# Patient Record
Sex: Female | Born: 1935 | Race: Black or African American | Hispanic: No
Health system: Southern US, Community
[De-identification: ages and names within clinical notes are randomized; demographics above are authoritative.]

---

## 2003-07-10 ENCOUNTER — Other Ambulatory Visit: Payer: Self-pay

## 2004-04-19 ENCOUNTER — Emergency Department: Payer: Self-pay | Admitting: General Practice

## 2004-08-19 ENCOUNTER — Other Ambulatory Visit: Payer: Self-pay

## 2004-08-19 ENCOUNTER — Emergency Department: Payer: Self-pay | Admitting: General Practice

## 2004-12-30 ENCOUNTER — Emergency Department: Payer: Self-pay | Admitting: Emergency Medicine

## 2005-09-16 ENCOUNTER — Emergency Department: Payer: Self-pay | Admitting: Emergency Medicine

## 2005-10-09 ENCOUNTER — Other Ambulatory Visit: Payer: Self-pay

## 2005-10-10 ENCOUNTER — Inpatient Hospital Stay: Payer: Self-pay | Admitting: Internal Medicine

## 2007-11-12 ENCOUNTER — Ambulatory Visit: Payer: Self-pay | Admitting: Internal Medicine

## 2008-10-14 ENCOUNTER — Inpatient Hospital Stay: Payer: Self-pay | Admitting: Internal Medicine

## 2009-06-19 ENCOUNTER — Ambulatory Visit: Payer: Self-pay | Admitting: Vascular Surgery

## 2010-06-29 ENCOUNTER — Emergency Department: Payer: Self-pay | Admitting: Emergency Medicine

## 2012-01-14 LAB — CBC
HCT: 40.3 % (ref 35.0–47.0)
HGB: 13 g/dL (ref 12.0–16.0)
MCHC: 32.2 g/dL (ref 32.0–36.0)
RBC: 4.32 10*6/uL (ref 3.80–5.20)
WBC: 8.6 10*3/uL (ref 3.6–11.0)

## 2012-01-14 LAB — COMPREHENSIVE METABOLIC PANEL
Albumin: 2.7 g/dL — ABNORMAL LOW (ref 3.4–5.0)
Anion Gap: 12 (ref 7–16)
BUN: 15 mg/dL (ref 7–18)
Bilirubin,Total: 0.3 mg/dL (ref 0.2–1.0)
Chloride: 104 mmol/L (ref 98–107)
Creatinine: 0.71 mg/dL (ref 0.60–1.30)
EGFR (African American): >60
Glucose: 287 mg/dL — ABNORMAL HIGH (ref 65–99)
Potassium: 4 mmol/L (ref 3.5–5.1)
SGOT(AST): 28 U/L (ref 15–37)
Sodium: 139 mmol/L (ref 136–145)
Total Protein: 7.2 g/dL (ref 6.4–8.2)

## 2012-01-14 LAB — URINALYSIS, COMPLETE
Glucose,UR: >=500 mg/dL (ref 0–75)
Ketone: NEGATIVE
Nitrite: NEGATIVE
Ph: 6 (ref 4.5–8.0)
Protein: NEGATIVE
Specific Gravity: 1.013 (ref 1.003–1.030)

## 2012-01-14 LAB — TROPONIN I: Troponin-I: <0.02 ng/mL

## 2012-01-14 LAB — LIPASE, BLOOD: Lipase: 88 U/L (ref 73–393)

## 2012-01-14 LAB — CK TOTAL AND CKMB (NOT AT ARMC): CK, Total: 43 U/L (ref 21–215)

## 2012-01-15 ENCOUNTER — Inpatient Hospital Stay: Payer: Self-pay | Admitting: Internal Medicine

## 2012-01-16 LAB — URINE CULTURE

## 2012-01-16 LAB — BASIC METABOLIC PANEL
BUN: 9 mg/dL (ref 7–18)
Calcium, Total: 8.6 mg/dL (ref 8.5–10.1)
Co2: 24 mmol/L (ref 21–32)
EGFR (African American): >60
Sodium: 141 mmol/L (ref 136–145)

## 2012-01-20 LAB — CULTURE, BLOOD (SINGLE)

## 2013-01-10 ENCOUNTER — Emergency Department: Payer: Self-pay | Admitting: Unknown Physician Specialty

## 2013-01-10 LAB — URINALYSIS, COMPLETE
Bilirubin,UR: NEGATIVE
Blood: NEGATIVE
Glucose,UR: NEGATIVE mg/dL (ref 0–75)
Ketone: NEGATIVE
Nitrite: NEGATIVE
Ph: 7 (ref 4.5–8.0)
Protein: 100
Specific Gravity: 1.024 (ref 1.003–1.030)
Squamous Epithelial: 1
WBC UR: 67 /HPF (ref 0–5)

## 2013-01-10 LAB — COMPREHENSIVE METABOLIC PANEL
BUN: 14 mg/dL (ref 7–18)
Creatinine: 0.92 mg/dL (ref 0.60–1.30)
EGFR (African American): 60 — ABNORMAL LOW
Potassium: 3.5 mmol/L (ref 3.5–5.1)
SGOT(AST): 24 U/L (ref 15–37)
SGPT (ALT): 21 U/L (ref 12–78)
Sodium: 142 mmol/L (ref 136–145)
Total Protein: 7.4 g/dL (ref 6.4–8.2)

## 2013-01-10 LAB — CBC WITH DIFFERENTIAL/PLATELET
Basophil #: 0.1 10*3/uL (ref 0.0–0.1)
Basophil %: 1.2 %
Eosinophil #: 0.1 10*3/uL (ref 0.0–0.7)
HCT: 42.4 % (ref 35.0–47.0)
Lymphocyte %: 16.8 %
MCH: 28.9 pg (ref 26.0–34.0)
MCHC: 31.9 g/dL — ABNORMAL LOW (ref 32.0–36.0)
Platelet: 173 10*3/uL (ref 150–440)
RBC: 4.69 10*6/uL (ref 3.80–5.20)
RDW: 14.7 % — ABNORMAL HIGH (ref 11.5–14.5)
WBC: 5.9 10*3/uL (ref 3.6–11.0)

## 2013-01-11 LAB — URINE CULTURE

## 2013-01-28 ENCOUNTER — Ambulatory Visit: Payer: Self-pay | Admitting: Internal Medicine

## 2013-02-20 LAB — CBC
HCT: 38.1 % (ref 35.0–47.0)
HGB: 12.9 g/dL (ref 12.0–16.0)
MCHC: 33.9 g/dL (ref 32.0–36.0)
MCV: 91 fL (ref 80–100)
Platelet: 210 10*3/uL (ref 150–440)
RDW: 14.5 % (ref 11.5–14.5)

## 2013-02-20 LAB — URINALYSIS, COMPLETE
Bilirubin,UR: NEGATIVE
Blood: NEGATIVE
Ketone: NEGATIVE
Ph: 6 (ref 4.5–8.0)
Protein: 25
RBC,UR: 2 /HPF (ref 0–5)
WBC UR: 73 /HPF (ref 0–5)

## 2013-02-20 LAB — COMPREHENSIVE METABOLIC PANEL
Alkaline Phosphatase: 127 U/L (ref 50–136)
BUN: 23 mg/dL — ABNORMAL HIGH (ref 7–18)
Calcium, Total: 9.2 mg/dL (ref 8.5–10.1)
Chloride: 106 mmol/L (ref 98–107)
Co2: 22 mmol/L (ref 21–32)
EGFR (Non-African Amer.): >60
Glucose: 234 mg/dL — ABNORMAL HIGH (ref 65–99)
Osmolality: 285 (ref 275–301)
Potassium: 3.6 mmol/L (ref 3.5–5.1)
SGOT(AST): 28 U/L (ref 15–37)

## 2013-02-20 LAB — TROPONIN I: Troponin-I: <0.02 ng/mL

## 2013-02-21 ENCOUNTER — Inpatient Hospital Stay: Payer: Self-pay | Admitting: Internal Medicine

## 2013-02-22 LAB — BASIC METABOLIC PANEL
Calcium, Total: 8.4 mg/dL — ABNORMAL LOW (ref 8.5–10.1)
Chloride: 112 mmol/L — ABNORMAL HIGH (ref 98–107)
Creatinine: 0.63 mg/dL (ref 0.60–1.30)
Glucose: 173 mg/dL — ABNORMAL HIGH (ref 65–99)
Osmolality: 285 (ref 275–301)
Sodium: 140 mmol/L (ref 136–145)

## 2013-02-22 LAB — CBC WITH DIFFERENTIAL/PLATELET
Eosinophil #: 0.1 10*3/uL (ref 0.0–0.7)
Lymphocyte %: 15.4 %
MCH: 30.7 pg (ref 26.0–34.0)
MCHC: 34.1 g/dL (ref 32.0–36.0)
MCV: 90 fL (ref 80–100)
Monocyte #: 0.7 x10 3/mm (ref 0.2–0.9)
Neutrophil #: 5.9 10*3/uL (ref 1.4–6.5)
Neutrophil %: 73.7 %
Platelet: 208 10*3/uL (ref 150–440)
RBC: 3.42 10*6/uL — ABNORMAL LOW (ref 3.80–5.20)
RDW: 14.4 % (ref 11.5–14.5)
WBC: 8 10*3/uL (ref 3.6–11.0)

## 2013-02-24 LAB — CBC WITH DIFFERENTIAL/PLATELET
Basophil %: 1 %
Eosinophil #: 0.2 10*3/uL (ref 0.0–0.7)
Eosinophil %: 3.2 %
HCT: 31.7 % — ABNORMAL LOW (ref 35.0–47.0)
HGB: 10.6 g/dL — ABNORMAL LOW (ref 12.0–16.0)
Lymphocyte #: 1.3 10*3/uL (ref 1.0–3.6)
Lymphocyte %: 19.4 %
MCHC: 33.4 g/dL (ref 32.0–36.0)
MCV: 91 fL (ref 80–100)
Monocyte #: 0.6 x10 3/mm (ref 0.2–0.9)
RBC: 3.48 10*6/uL — ABNORMAL LOW (ref 3.80–5.20)
RDW: 14.3 % (ref 11.5–14.5)
WBC: 6.5 10*3/uL (ref 3.6–11.0)

## 2013-02-25 LAB — CULTURE, BLOOD (SINGLE)

## 2013-02-26 LAB — WOUND CULTURE

## 2013-02-28 ENCOUNTER — Ambulatory Visit: Payer: Self-pay | Admitting: Internal Medicine

## 2013-03-30 DEATH — deceased

## 2014-10-16 IMAGING — CR DG CHEST 2V
1 series · 2 of 2 positions shown · non-contrast
Comparison: none

REASON FOR EXAM: fall
COMMENTS:

PROCEDURE:     DXR - DXR CHEST PA (OR AP) AND LATERAL  - January 10, 2013  [DATE]
RESULT:     Comparison: 01/15/2012

[Series 1: ap · 0.17mm/px · 2 of 2 slices shown]
[im 1/2]
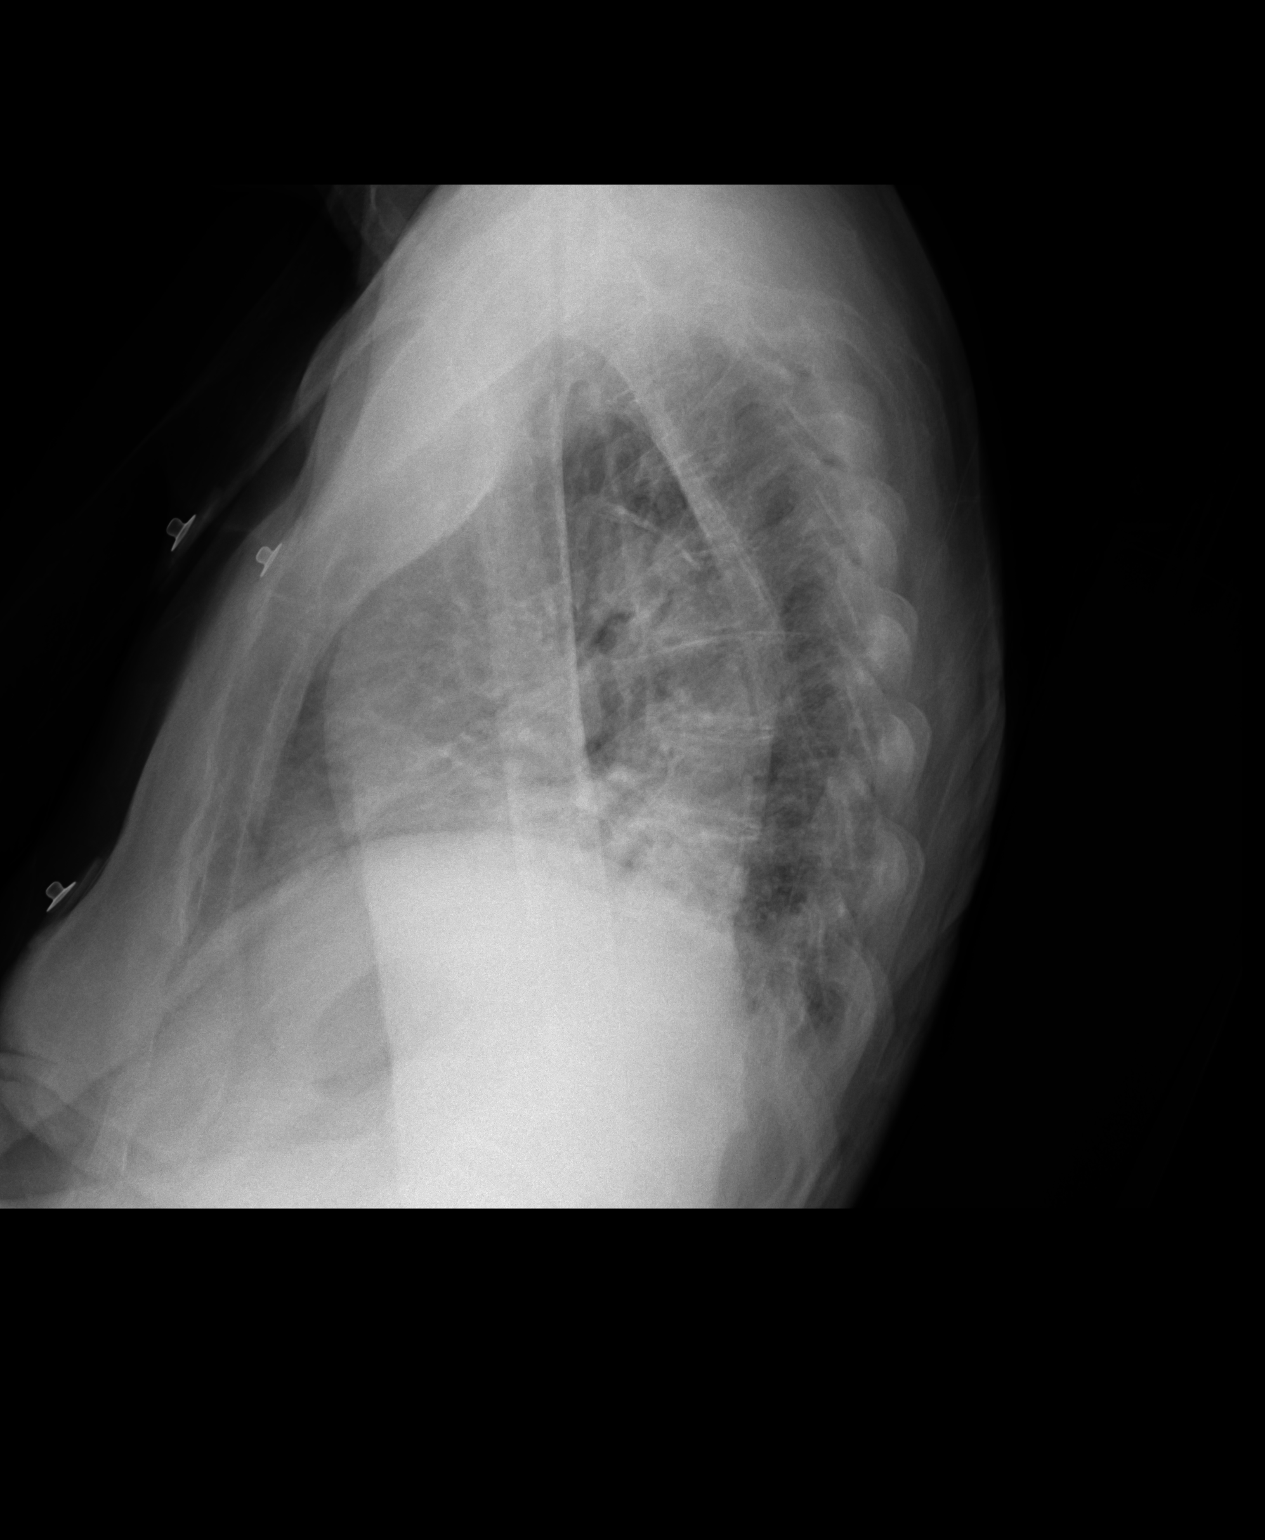
[im 2/2]
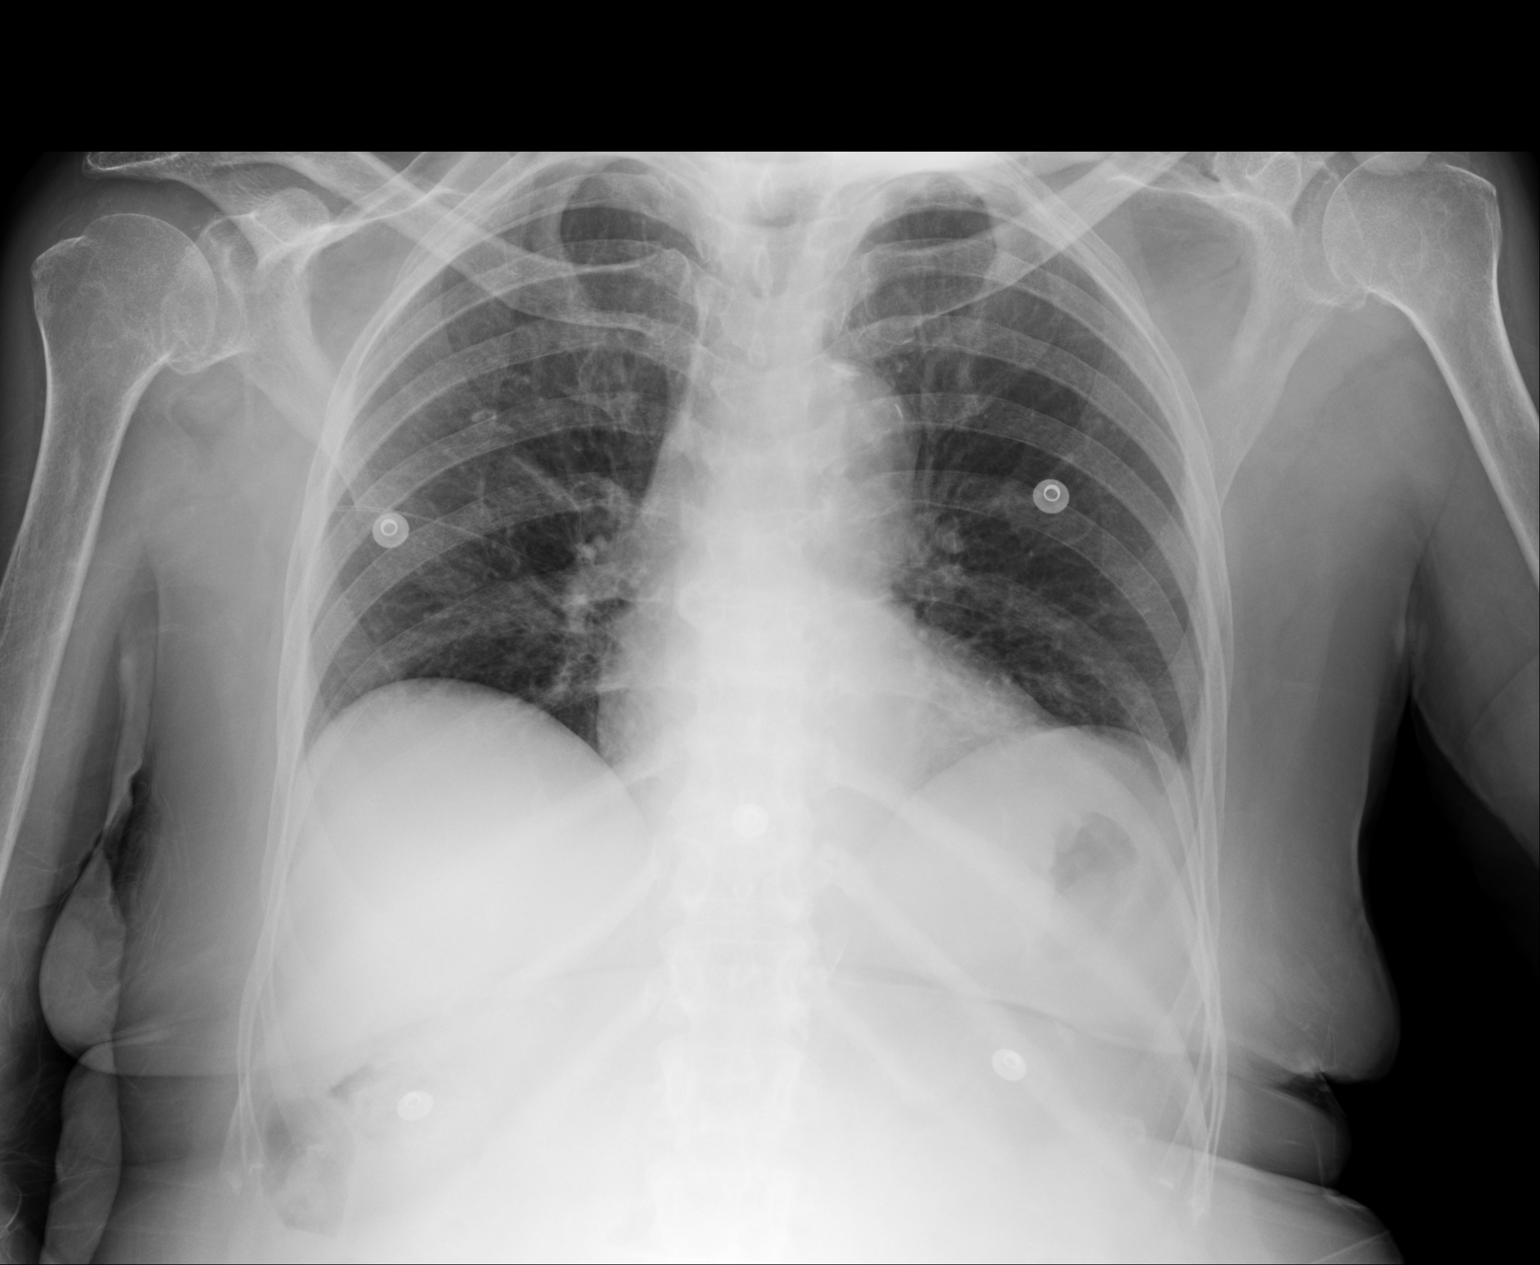

[2 of 2 positions shown; findings below may reference images not displayed]

FINDINGS: Evaluation is slightly limited by patient positioning. The heart and
mediastinum are stable. The lung volumes are low. Mild basilar opacities are
likely secondary to atelectasis.
IMPRESSION: Mild basilar opacities are likely secondary to atelectasis given the low
lung volumes.

[REDACTED]

## 2014-10-17 NOTE — H&P (Signed)
PATIENT NAME:  Angie Peters, Angie Peters MR#:  [id number] DATE OF BIRTH:  Jun 24, 1911  DATE OF ADMISSION:  01/15/2012  CHIEF COMPLAINT: Altered mental status and decreased responsiveness.   HISTORY OF PRESENT ILLNESS: Mrs. Hoheisel is a 81 year old African-American female with history of dementia. She is bedridden, cared for by her family. Lately it was noticed by the family that she is not eating or swallowing her food. Over the last 24 hours she became less responsive and she was brought to the Emergency Department for evaluation. Work-up here was unremarkable except for underlying urinary tract infection. She had a CT scan of the head that showed small vessel disease, but no acute changes. The family requested conservative therapy, no extraordinary measures, no PEG tube. They do not want Hospice involvement at this point. However, if she does not improve they will consider the option to go home with Hospice.   REVIEW OF SYSTEMS: 10-point system review was unobtainable due to patient being unresponsive.   PAST MEDICAL HISTORY:  1. History of dementia.  2. Diabetes mellitus type 2.  3. Systemic hypertension.   PAST SURGICAL HISTORY: Hysterectomy.   ADMISSION MEDICATIONS:  1. Omeprazole 20 mg once a day.  2. NovoLog insulin sliding scale. 3. Lantus 12 units once a day at bedtime.  4. Colcrys 0.6 mg once a day.  5. Centrum silver, 1 tablet once a day.  6. Aspirin 81 mg a day.   ALLERGIES: No known drug allergies.   SOCIAL HABITS: Nonsmoker. No history of alcohol abuse.   FAMILY HISTORY: Her mother died from complications of congestive heart failure, the age was unclear to her and the daughters could not give adequate information. Her father died in his 23s from alcohol complications. She has two daughters who have hypertension. Four of her children have diabetes mellitus.   PHYSICAL EXAMINATION:  VITAL SIGNS: Blood pressure 200/83, respiratory rate 18, pulse 88, temperature 97.6, oxygen saturation  99%.   GENERAL APPEARANCE: Elderly female lying in bed in no acute distress. She does not communicate.   HEAD: No pallor. No icterus. No cyanosis.   ENT: Hearing cannot be assessed due to the patient's unresponsiveness. Nasal mucosa and lips were unremarkable. I could not examine the mouth as the patient is resisting opening the mouth.   EYES: Normal eyelids, could not assess conjunctivae or the pupils, the patient again is resistant to opening her eyes.   NECK: Supple. Trachea at midline. No thyromegaly. No cervical lymphadenopathy. No masses.   HEART: Normal S1, S2. No S3 or S4. No murmur. No gallop. No carotid bruits.   RESPIRATORY: Normal breathing pattern without use of accessory muscles. No rales. No wheezing.   ABDOMEN: Soft without rigidity. No masses. No hepatosplenomegaly. No hernias.   SKIN: No ulcers. No subcutaneous nodules.   MUSCULOSKELETAL: No joint swelling. No clubbing.   NEUROLOGIC: The patient is obtunded. Not much response other than minimal movements of upper extremities and the face. Resists opening the mouth or the eyes. No facial asymmetry.   PSYCHIATRIC: Unobtainable due to the patient's decreased responsiveness.   LABORATORY, DIAGNOSTIC, AND RADIOLOGICAL DATA: CT scan of the head showed no acute intracranial process. Chronic small vessel ischemic disease. Serum glucose 287, BUN 15, creatinine 0.7, sodium 139, potassium 4. Liver function tests showed total protein 7.2, albumin 2.7, alkaline phosphatase 144, AST and ALT were normal. Troponin less than 0.02. CBC was normal. Urinalysis showed cloudy urine, more than 500 glucose, 50 white blood cells, and +3 bacteria.   ASSESSMENT:  1. Altered mental status and decreased responsiveness secondary to likely progression of her dementia, but I cannot rule out possible superimposed stroke that did not show up on the CT scan.  2. Urinary tract infection.  3. Dementia.  4. Diabetes mellitus, type 2.  5. Severe systemic  hypertension, uncontrolled.  6. DO NOT RESUSCITATE status.  PLAN: We will admit the patient. IV hydration. IV antibiotic using Rocephin. Blood pressure control using clonidine patch and p.r.n. doses of intravenous labetalol. I had a discussion with the family. All three daughters here are in agreement with conservative management, no extraordinary measures. No PEG tube.  They are in agreement with the line of management of IV hydration and IV antibiotics.  If her mental status improves and she is able to eat and drink, then she will go home. If she does not improve they are also in agreement that she will go home with Hospice this time. The patient does not have a LIVING WILL but again, she has documented DO NOT RESUSCITATE status, a form that was signed by her primary care physician.   TIME SPENT EVALUATING THIS PATIENT: More than 55 minutes.    ____________________________ Carney CornersAmir M. Rudene Rearwish, MD amd:bjt D: 01/15/2012 00:28:34 ET T: 01/15/2012 07:30:58 ET JOB#: [id number]  cc: Carney CornersAmir M. Rudene Rearwish, MD, <Dictator> Jillene Bucksenny C. Arlana Pouchate, MD Carney CornersAMIR M Elvia Aydin MD ELECTRONICALLY SIGNED 01/16/2012 4:58

## 2014-10-17 NOTE — Discharge Summary (Signed)
PATIENT NAME:  Angie Peters, Angie Peters MR#:  [id number] DATE OF BIRTH:  30-May-1911  DATE OF ADMISSION:  01/15/2012 DATE OF DISCHARGE:  01/16/2012  PRIMARY CARE PHYSICIAN: Dr. Dewaine Oatsenny Tate    FINAL DIAGNOSES:  1. Encephalopathy.  2. Urinary tract infection.  3. Underlying dementia.  4. Malignant hypertension.  5. Diabetes.  6. Gout.   MEDICATIONS ON DISCHARGE:  1. Eyedrop that she normally takes at home. 2. Aspirin 81 mg daily.  3. Centrum Silver 1 tablet daily.  4. Colcrys 0.6 mg daily.  5. Lantus 12 units subcutaneous injection at night.  6. Omeprazole 20 mg daily.  7. NovoLog sliding scale. 8. Clonidine patch 0.1 mg extended-release once a week transdermally. 9. Initially prescribed Cipro 500 mg twice a day but once urine culture came back I had to change that to Keflex 500 mg 3 times a day for five more days. I called the patient in the pharmacy to call in the Keflex and canceled the Cipro prescription. 10. DuoDERM to buttock every five days.   DIET: Low sodium, 1800 ADA. Diet is puree. Moisten foods.   FOLLOW-UP: Follow-up in one week with Dr. Dewaine Oatsenny Tate.   REASON FOR ADMISSION: The patient was admitted 01/15/2012, discharged 01/16/2012. Please see admitting history and physical by Dr. Rudene Rearwish.   HISTORY OF PRESENT ILLNESS: The patient is a [age] year old female who came in with altered mental status, decreased responsiveness, history of dementia, bedridden, does not talk but normally she eats and is awake, found to have altered mental status, urinary tract infection, was given IV fluids and IV Rocephin.   LABORATORY AND RADIOLOGICAL DATA: Urinalysis 3+ bacteria, 2+ leukocyte esterase. Lipase 88. Troponin negative. Glucose 287, BUN 15, creatinine 0.71, sodium 139, potassium 4.0, chloride 104, CO2 23, calcium 8.8. Liver function tests alkaline phosphatase slightly elevated at 144. White blood cell count 8.6, hemoglobin and hematocrit 13.0 and 40.3, platelet count 212.   CT scan of the head  showed no acute intracranial process.   Blood cultures were negative. Repeat chemistry glucose 180, BUN 9, creatinine 0.67, sodium 141, potassium 3.7, chloride 109, CO2 24. Urine culture grew out 50,000 Escherichia coli resistant to Cipro but sensitive to cefazolin and ceftriaxone.   HOSPITAL COURSE PER PROBLEM LIST:  1. For the patient's encephalopathy, the patient's mental status improved back to her baseline of being more alert and being able to eat. With that the patient was discharged home with her home health care and family who takes care of her.  2. For her urinary tract infection, the patient was on Rocephin here in the hospital, switched over to p.o. Cipro. The patient was already home when I got the culture results back that is resistant to the Cipro. I called the patient's daughter and updated that I am changing the antibiotics. I called the pharmacy and changed the antibiotic to Keflex 500 mg t.i.d. for five more days. Pharmacist will call the family when ready.  3. For her underlying dementia, overall prognosis is poor.  4. For her malignant hypertension, clonidine patch given.  5. For her diabetes, Lantus can be resumed and sliding scale.  6. Gout. Colcrys continued. 7. The patient can also continue her eyedrops.   TIME SPENT ON DISCHARGE: 35 minutes.   ____________________________ Herschell Dimesichard J. Renae GlossWieting, MD rjw:drc D: 01/16/2012 17:57:59 ET T: 01/19/2012 09:31:11 ET JOB#: [id number] cc: Herschell Dimesichard J. Renae GlossWieting, MD, <Dictator>, Jillene Bucksenny C. Arlana Pouchate, MD Salley ScarletICHARD J Jolean Madariaga MD ELECTRONICALLY SIGNED 01/21/2012 13:06

## 2014-10-20 NOTE — Consult Note (Signed)
   Comments   Called by nurse. Family requesting pain medication due to grimacing/moaning. Pt unable to take oral meds well due to her AMS. Family agreeable to trying IV morphine. Will order.  meeting arranged for tomorrow morning.   Electronic Signatures: Borders, Daryl EasternJoshua R (NP)  (Signed 27-Aug-14 15:44)  Authored: Palliative Care   Last Updated: 27-Aug-14 15:44 by Malachy MoanBorders, Joshua R (NP)

## 2014-10-20 NOTE — Discharge Summary (Signed)
PATIENT NAME:  Angie Peters, Angie Peters MR#:  [id number] DATE OF BIRTH:  08-30-1910  DATE OF ADMISSION:  02/21/2013 DATE OF DISCHARGE:  02/24/2013  The patient was discharged to hospice home.    DIAGNOSES AT THE TIME OF DISCHARGE:   1.  Altered mental status.  2.  Metabolic encephalopathy secondary to urinary tract infection.  3.  Urinary tract infection.  4.  Decreased oral intake.  5.  Chronic sacral decubitus ulcer.  6.  Diabetes mellitus.  7.  Remote history of gastrointestinal bleed.   The patient was discharged to hospice home, comfort care orders were admitted.   PRESENTATION AND HOSPITAL COURSE:  This is a [age] year old female who had dementia, diabetes history, hypertension, was taking aspirin and brought to the Emergency Room for worsening mental status. She was found having urinary tract infection. Her blood pressure was also elevated. CAT scan of the head revealed generalized cerebral atrophy.   HOSPITAL COURSE AND STAY: The patient's baseline was bedbound and not speaking and needing assistance with feeding for the last few years at home. She was started on UTI treatment with slight improvement in the mental status, but still remained lethargic,  sometimes opening eye.  Bactrim and Rocephin for UTI. Due to her overall poor condition and decreased oral intake, palliative care consult was called in. They discussed with the family in detail and finally family agreed for hospice services and the patient was transferred to hospice home.   IMPORTANT LABORATORY RESULTS IN THE HOSPITAL STAY:  Total white cell count was 8000, hemoglobin was 10.5. Urinalysis was positive with 73 WBCs and 3+ leukocyte esterase. Urine culture with more than 100,000 colony forming unit, Enterococcus faecalis and Streptococcus viridans.   TOTAL TIME SPENT ON THIS DISCHARGE: 35 minutes.   ____________________________ Hope PigeonVaibhavkumar G. Elisabeth PigeonVachhani, MD vgv:cc D: 02/28/2013 23:14:06 ET T: 02/28/2013 23:42:42  ET JOB#: [id number]  cc: Hope PigeonVaibhavkumar G. Elisabeth PigeonVachhani, MD, <Dictator> Altamese DillingVAIBHAVKUMAR Barby Colvard MD ELECTRONICALLY SIGNED 03/04/2013 14:37

## 2014-10-20 NOTE — H&P (Signed)
PATIENT NAME:  Angie MurdochLLISON, Soliana MR#:  [id number] DATE OF BIRTH:  12-Mar-1911  DATE OF ADMISSION:  02/21/2013  PRIMARY CARE PHYSICIAN:  Dr. Dewaine Oatsenny Tate  REFERRING PHYSICIAN: Dr. Zenda AlpersWebster.   CHIEF COMPLAINT: Altered mental status.   HISTORY OF PRESENT ILLNESS: The patient is a 81 year old African American female with past medical history of dementia, diabetes mellitus, systemic hypertension, old history of GI bleed, currently taking aspirin, is brought into the ER by her three daughters for worsening of mental status. The patient is, bedbound and nonverbal as her baseline. She tolerates food by mouth. According to her three daughters who are at bedside,  lately the patient has been sleeping too much and not eating well. Last week she was treated with Macrobid for a urinary tract infection with no significant improvement. In the ER, the patient's blood pressure is elevated. Initially it was at 224/132, subsequently during my examination 179/82 with pulse of 99. CAT scan of the head revealed generalized cerebral atrophy, chronic microvascular changes, ischemic changes. The patient was given IV Rocephin in the ER and hospitalist team is called to admit the patient. I was unable to get any history from the patient as she is nonverbal. The history is obtained from the patient's three daughters and old medical records.   PAST MEDICAL HISTORY: Chronic dementia. Remote history of GI bleed currently, the patient is taking aspirin. Diabetes mellitus, hypertension. History of stroke with left upper extremity contracture.   PAST SURGICAL HISTORY: Hysterectomy.   ALLERGIES:  No known drug allergies.   PSYCHOSOCIAL HISTORY: Lives at home. Her three daughters take care of her.  No history of smoking, alcohol or illicit drug usage.   FAMILY HISTORY: Her mother died from congestive heart failure and father died at age 79 from alcohol complications.   HOME MEDICATIONS: Aspirin 81 mg once daily, clonidine 0.1 mg patch to  change transdermally once a week, omeprazole 20 mg once daily. The patient is put on Lantus and NovoLog 70/30. This needs to be clarified. Nitrofurantoin 1 capsule 2 times a day. She has finished a course.   REVIEW OF SYSTEMS: Unobtainable.   PHYSICAL EXAMINATION: VITAL SIGNS: Temperature at 101, pulse 99, respirations 18, blood pressure 179/82, pulse oximetry 100%.  GENERAL APPEARANCE: The patient is not in acute distress. Moderately built and nourished. Responding to touch and pain.  HEENT:  I could not examine pupils as the patient is tightly closing her eyelids while trying to open them. No sinus tenderness. Oral cavity: No dentures,  No teeth, dry mucous membranes.  NECK: Supple. No JVD or thyromegaly.  LUNGS: Clear to auscultation bilaterally. No accessory muscle usage. No anterior chest wall tenderness on palpation.  CARDIAC: S1, S2 normal. Regular rate and rhythm. Positive murmurs.  GASTROINTESTINAL: Soft. Bowel sounds are positive in all four quadrants. Nontender, nondistended. No masses felt. No hepatosplenomegaly.  NEUROLOGIC: Lethargic,  winking her eyes to verbal commands, and pain stimuli. Left upper extremity and has a chronic contracture from previous strokes.  SKIN: Warm to touch. Decreased turgor. Dry in nature. No rashes. No lesions.  EXTREMITIES: No edema. No cyanosis. No clubbing.   PSYCHIATRIC: Could not be elicited as the patient is demented and nonverbal.   LABORATORY, DIAGNOSTIC AND RADIOLOGIC DATA:  CAT scan general cerebral atrophy, ventricles are prominent ,out of proportion to the sulci, consistent with the degree of overly normal pressure hydrocephalus. There is concentric microvascular ischemic disease ; hypodense area along the left frontal and parietal the calvarium, likely representing a meningioma,  which has been not significantly changed since the prior CT.  Calcified plaque is present within the intracranial vertebral artery.   A 12-lead EKG has revealed sinus  tachycardia with heart rate 112, normal PR and QRS intervals, left ventricular hypertrophy, nonspecific ST-T wave changes.   ASSESSMENT AND PLAN: A 81 year old female brought into the ER for altered mental status, who will be admitted with the following assessment and plan.  1.  Altered mental status probably from acute cystitis, failed on outpatient antibiotic Macrobid. We will keep her nothing by mouth as the patient is with altered mentation. We will provide her IV fluids. Urine culture ordered. We will continue IV Rocephin.  2.  Chronic sacral decubitus ulcer. We will get wound cultures and reposition the patient every two hours.  3.  Hypertension, uncontrolled, probably because of the decreased oral intake and not taking her oral medication.  We will restart her medications, including clonidine patch and also provide her Lopressor intravenous as needed.  4.  Diabetes mellitus.  As the patient is nothing by mouth, we will hold off on her long-acting insulin, put her on sliding scale insulin  5.  Remote history of gastrointestinal  bleed. Patient is taking aspirin at home. We will continue that and monitor platelet count closely.  6.  The diagnosis and plan of care was discussed in detail with the patient's three daughters at bedside. They all verbalized understanding of the plan.   CODE STATUS: The patient's code status is DO NOT RESUSCITATE.   Daughter Claris Che is her medical power of attorney.   TOTAL TIME SPENT ON ADMISSION:  50 minutes.   ____________________________ Ramonita Lab, MD ag:cc D: 02/21/2013 02:20:54 ET T: 02/21/2013 02:56:12 ET JOB#: [id number]  cc: Ramonita Lab, MD, <Dictator> Ramonita Lab, MD, <Dictator> Jillene Bucks. Arlana Pouch, MD Ramonita Lab MD ELECTRONICALLY SIGNED 02/22/2013 7:08

## 2014-11-26 IMAGING — CT CT HEAD WITHOUT CONTRAST
2 of 3 series · 16 of 30 positions shown, 18 images · non-contrast
Comparison: none

REASON FOR EXAM: altered mental status
COMMENTS:   May transport without cardiac monitor

[Series 2: without · axial · non-contrast · 0.42mm/px · z∈[+276,+386]mm · 8 of 30 slices shown]
[im 4/30  brain]
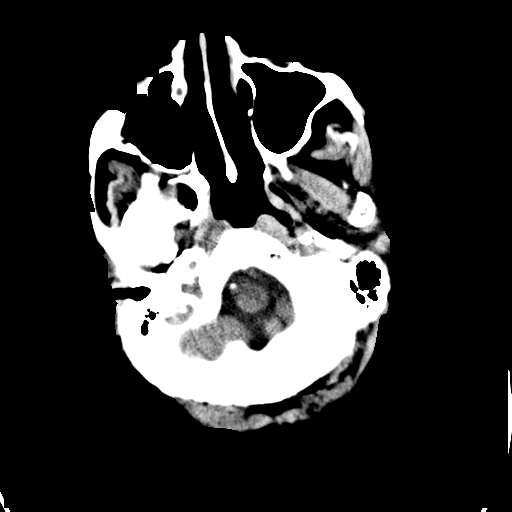
[im 7/30  brain]
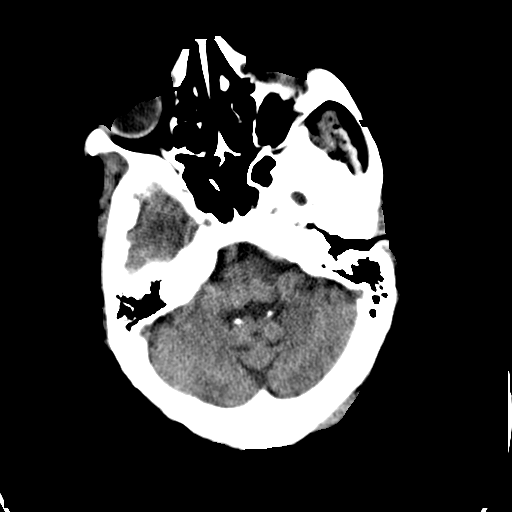
[im 10/30  brain]
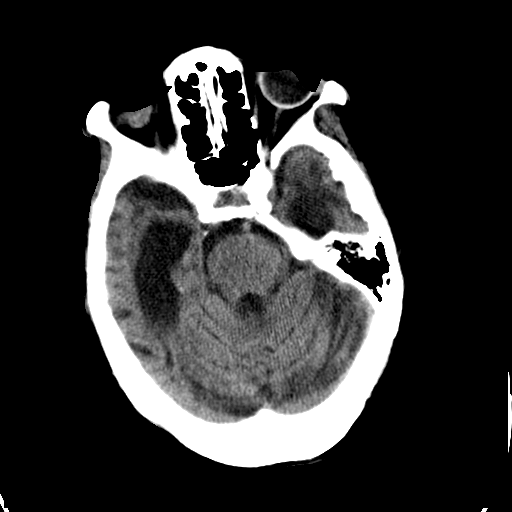
[im 13/30  brain]
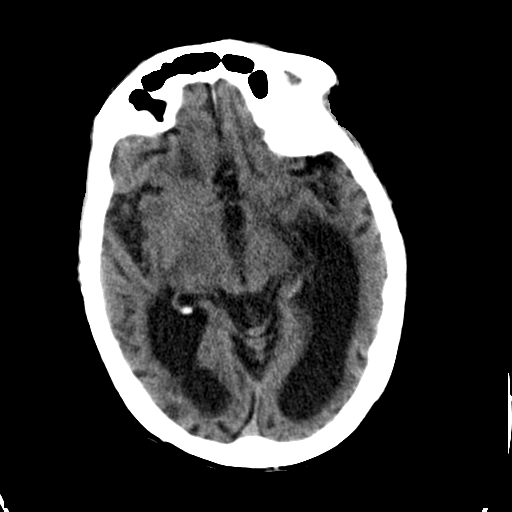
[im 17/30  brain]
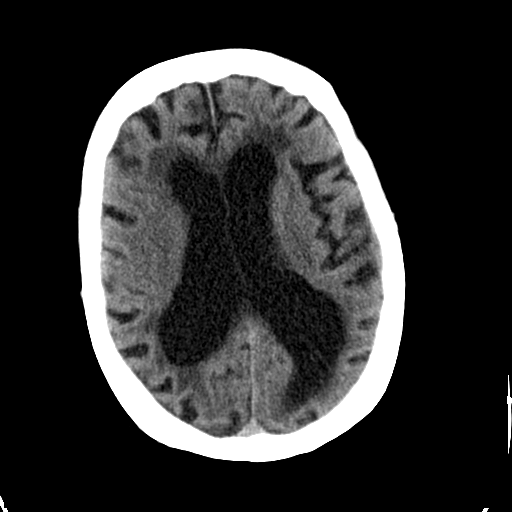
[im 20/30  brain]
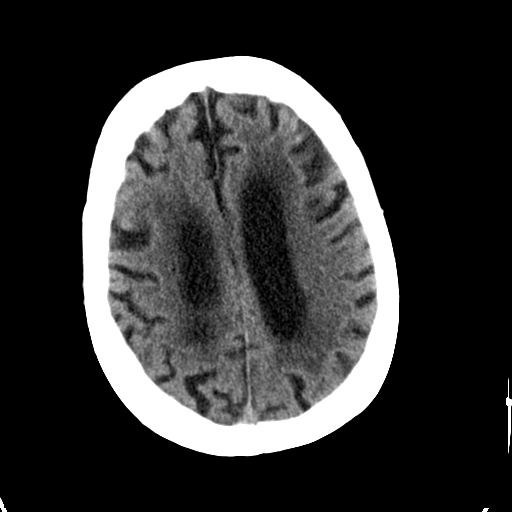
[im 23/30  brain]
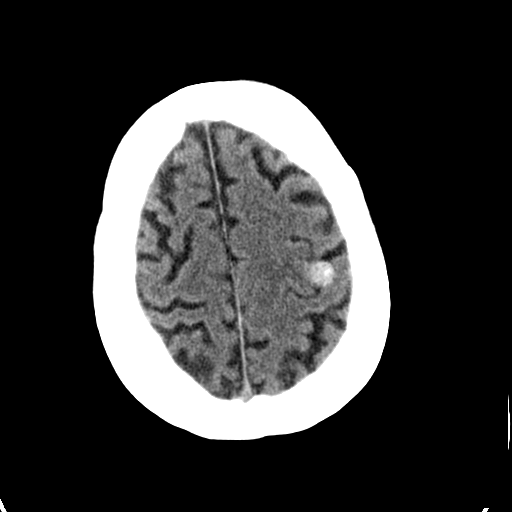
[im 26/30  brain]
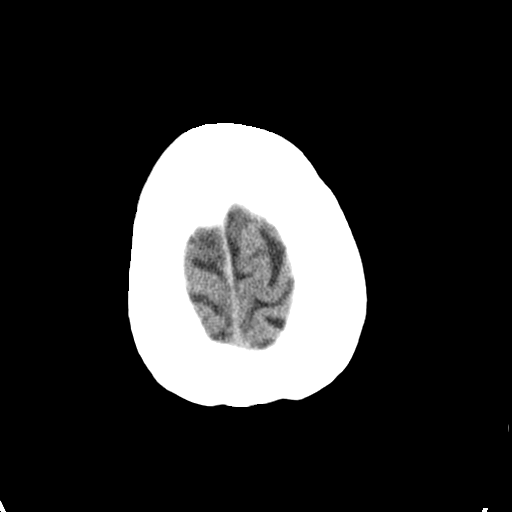

[Series 4: without recon · axial · non-contrast · 0.42mm/px · z∈[+278,+383]mm · 8 of 28 slices shown, 10 images]
[im 4/28  brain]
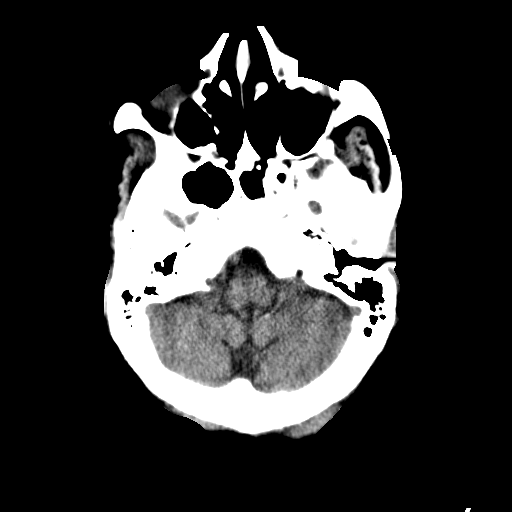
[im 4/28  bone]
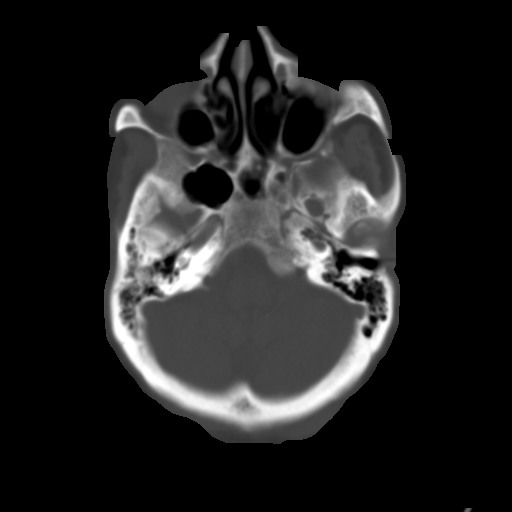
[im 7/28  brain]
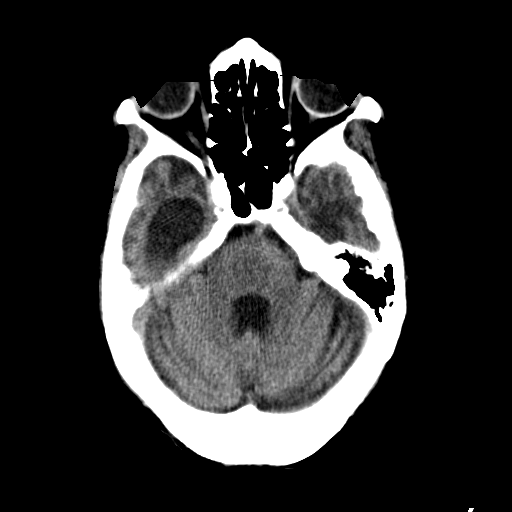
[im 10/28  brain]
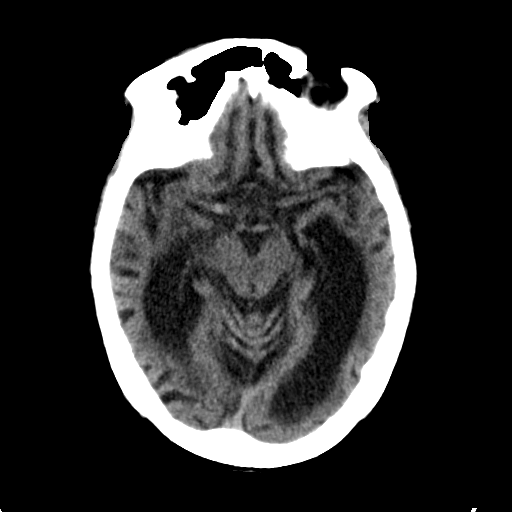
[im 13/28  brain]
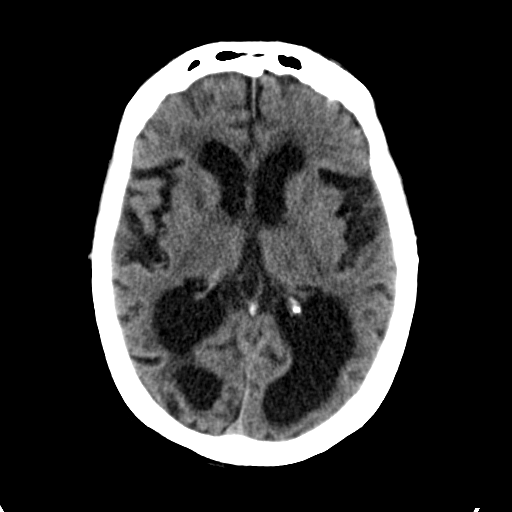
[im 16/28  brain]
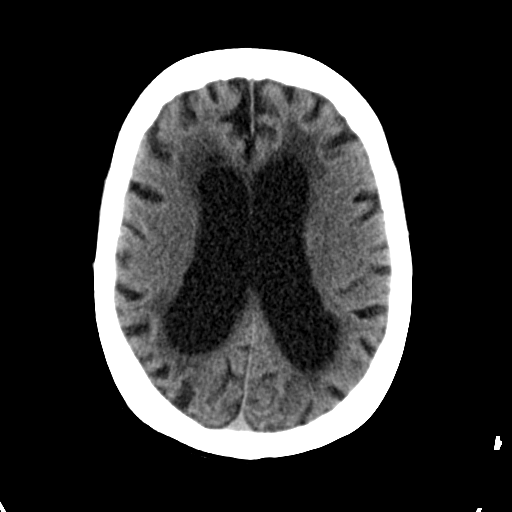
[im 16/28  bone]
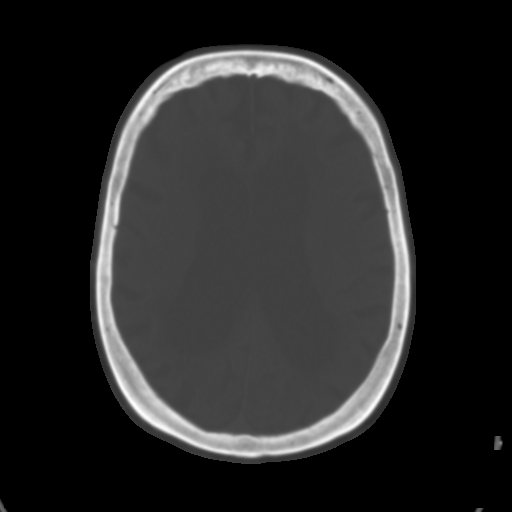
[im 19/28  brain]
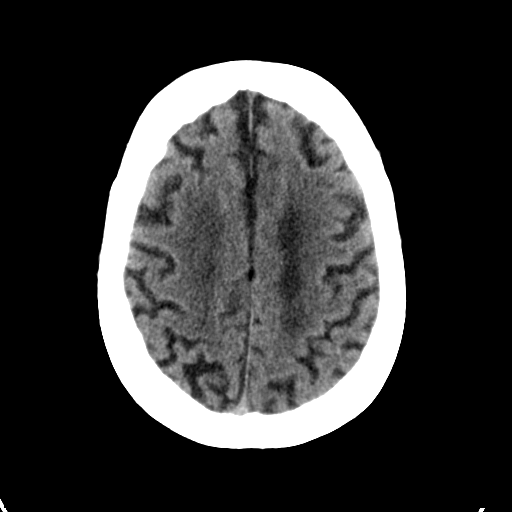
[im 22/28  brain]
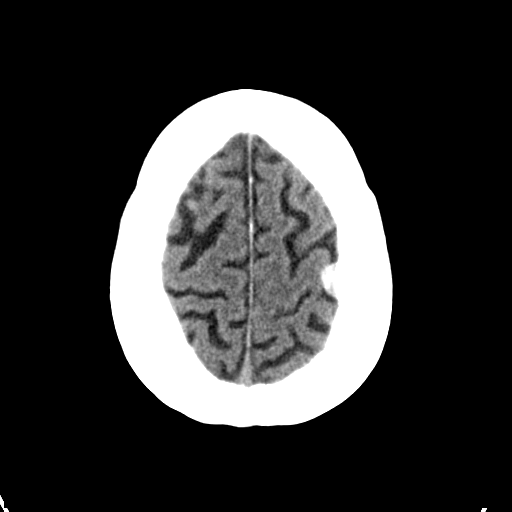
[im 25/28  brain]
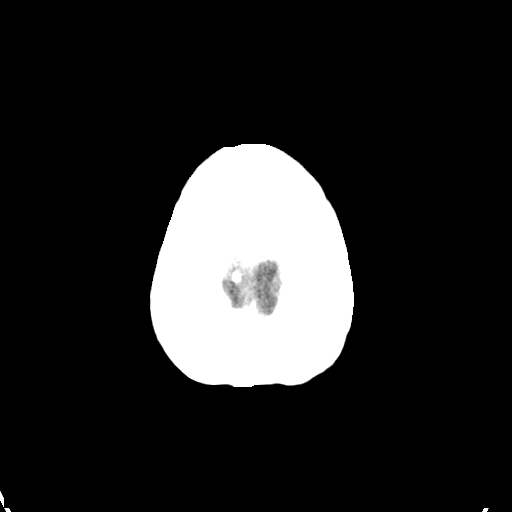

[16 of 30 positions shown; findings below may reference images not displayed]

PROCEDURE:     CT  - CT HEAD WITHOUT CONTRAST  - February 20, 2013  [DATE]

RESULT:     Axial noncontrast CT scanning was performed through the brain
with reconstructions at 5 mm intervals and slice thicknesses. Comparison
made to study January 14, 2012.

There is moderate to severe ventriculomegaly. There is mild to moderate
diffuse cerebral and cerebellar atrophy. These findings are stable. There is
no shift of the midline. There is a stable calcified mass in the high left
parietal lobe most compatible with a meningioma. There is no evidence of an
acute intracranial hemorrhage. At bone window settings there is soft tissue
density material in the left maxillary sinus. There is no evidence of an
acute skull fracture.
IMPRESSION: 1. There is no evidence of an acute ischemic or hemorrhagic event.
2. There are staples moderate severe hydrocephalus without shift of the
midline.
3. There is a stable calcified mass measuring approximately 1.5 cm in
diameter along the convexity of the high left parietal lobe.

A preliminary report was sent to the [HOSPITAL] the conclusion
of the study.

[REDACTED]
